# Patient Record
Sex: Female | Born: 1970 | Race: Black or African American | Hispanic: No | Marital: Single | Smoking: Never smoker
Health system: Southern US, Community
[De-identification: ages and names within clinical notes are randomized; demographics above are authoritative.]

---

## 1998-12-08 ENCOUNTER — Emergency Department (HOSPITAL_COMMUNITY): Admission: EM | Admit: 1998-12-08 | Discharge: 1998-12-09 | Payer: Self-pay | Admitting: Endocrinology

## 2000-03-11 ENCOUNTER — Encounter: Payer: Self-pay | Admitting: Orthopedic Surgery

## 2000-03-11 ENCOUNTER — Ambulatory Visit (HOSPITAL_COMMUNITY): Admission: RE | Admit: 2000-03-11 | Discharge: 2000-03-11 | Payer: Self-pay | Admitting: Orthopedic Surgery

## 2000-05-17 ENCOUNTER — Encounter: Payer: Self-pay | Admitting: Orthopedic Surgery

## 2000-05-17 ENCOUNTER — Encounter: Admission: RE | Admit: 2000-05-17 | Discharge: 2000-05-17 | Payer: Self-pay | Admitting: Orthopedic Surgery

## 2001-08-18 ENCOUNTER — Emergency Department (HOSPITAL_COMMUNITY): Admission: EM | Admit: 2001-08-18 | Discharge: 2001-08-18 | Payer: Self-pay | Admitting: Emergency Medicine

## 2004-06-23 ENCOUNTER — Emergency Department (HOSPITAL_COMMUNITY): Admission: EM | Admit: 2004-06-23 | Discharge: 2004-06-23 | Payer: Self-pay | Admitting: Emergency Medicine

## 2004-09-11 ENCOUNTER — Other Ambulatory Visit: Admission: RE | Admit: 2004-09-11 | Discharge: 2004-09-11 | Payer: Self-pay | Admitting: Family Medicine

## 2004-10-15 ENCOUNTER — Encounter: Admission: RE | Admit: 2004-10-15 | Discharge: 2004-10-15 | Payer: Self-pay | Admitting: Family Medicine

## 2005-09-05 ENCOUNTER — Emergency Department (HOSPITAL_COMMUNITY): Admission: EM | Admit: 2005-09-05 | Discharge: 2005-09-05 | Payer: Self-pay | Admitting: Family Medicine

## 2005-09-11 ENCOUNTER — Ambulatory Visit (HOSPITAL_COMMUNITY): Admission: RE | Admit: 2005-09-11 | Discharge: 2005-09-11 | Payer: Self-pay | Admitting: Family Medicine

## 2005-09-28 ENCOUNTER — Other Ambulatory Visit: Admission: RE | Admit: 2005-09-28 | Discharge: 2005-09-28 | Payer: Self-pay | Admitting: Family Medicine

## 2006-01-12 ENCOUNTER — Encounter: Admission: RE | Admit: 2006-01-12 | Discharge: 2006-01-12 | Payer: Self-pay | Admitting: Family Medicine

## 2007-03-10 ENCOUNTER — Emergency Department (HOSPITAL_COMMUNITY): Admission: EM | Admit: 2007-03-10 | Discharge: 2007-03-10 | Payer: Self-pay | Admitting: Emergency Medicine

## 2007-06-27 ENCOUNTER — Encounter: Admission: RE | Admit: 2007-06-27 | Discharge: 2007-06-27 | Payer: Self-pay | Admitting: Nephrology

## 2007-06-28 ENCOUNTER — Encounter: Admission: RE | Admit: 2007-06-28 | Discharge: 2007-06-28 | Payer: Self-pay | Admitting: Nephrology

## 2010-05-10 ENCOUNTER — Emergency Department (HOSPITAL_COMMUNITY): Admission: EM | Admit: 2010-05-10 | Discharge: 2010-05-10 | Payer: Self-pay | Admitting: Emergency Medicine
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
## 2014-07-25 ENCOUNTER — Other Ambulatory Visit: Payer: Self-pay | Admitting: Family Medicine

## 2014-07-25 DIAGNOSIS — Z1231 Encounter for screening mammogram for malignant neoplasm of breast: Secondary | ICD-10-CM

## 2014-08-07 ENCOUNTER — Ambulatory Visit
Admission: RE | Admit: 2014-08-07 | Discharge: 2014-08-07 | Disposition: A | Discharge status: Home / Self Care | Payer: BC Managed Care – PPO | Source: Ambulatory Visit | Attending: Family Medicine | Admitting: Family Medicine

## 2014-08-07 DIAGNOSIS — Z1231 Encounter for screening mammogram for malignant neoplasm of breast: Secondary | ICD-10-CM

## 2014-08-09 ENCOUNTER — Other Ambulatory Visit: Payer: Self-pay | Admitting: Family Medicine

## 2014-08-09 DIAGNOSIS — R928 Other abnormal and inconclusive findings on diagnostic imaging of breast: Secondary | ICD-10-CM

## 2014-08-15 ENCOUNTER — Ambulatory Visit
Admission: RE | Admit: 2014-08-15 | Discharge: 2014-08-15 | Disposition: A | Discharge status: Home / Self Care | Payer: BC Managed Care – PPO | Source: Ambulatory Visit | Attending: Family Medicine | Admitting: Family Medicine

## 2014-08-15 DIAGNOSIS — R928 Other abnormal and inconclusive findings on diagnostic imaging of breast: Secondary | ICD-10-CM

## 2015-09-05 ENCOUNTER — Ambulatory Visit (INDEPENDENT_AMBULATORY_CARE_PROVIDER_SITE_OTHER): Payer: BC Managed Care – PPO

## 2015-09-05 ENCOUNTER — Ambulatory Visit (INDEPENDENT_AMBULATORY_CARE_PROVIDER_SITE_OTHER): Payer: BC Managed Care – PPO | Admitting: Podiatry

## 2015-09-05 ENCOUNTER — Encounter: Payer: Self-pay | Admitting: Podiatry

## 2015-09-05 ENCOUNTER — Telehealth: Payer: Self-pay | Admitting: *Deleted

## 2015-09-05 VITALS — BP 114/68 | HR 85 | Resp 16

## 2015-09-05 DIAGNOSIS — M2011 Hallux valgus (acquired), right foot: Secondary | ICD-10-CM

## 2015-09-05 DIAGNOSIS — M722 Plantar fascial fibromatosis: Secondary | ICD-10-CM

## 2015-09-05 MED ORDER — METHYLPREDNISOLONE 4 MG PO TBPK: ORAL_TABLET | ORAL | Status: DC

## 2015-09-05 MED ORDER — MELOXICAM 15 MG PO TABS: 15.0000 mg | ORAL_TABLET | Freq: Every day | ORAL | Status: DC

## 2015-09-05 NOTE — Patient Instructions (Addendum)
Pre-Operative Instructions  Congratulations, you have decided to take an important step to improving your quality of life.  You can be assured that the doctors of [facility] will be with you every step of the way.  1. Plan to be at the surgery center/hospital at least 1 (one) hour prior to your scheduled time unless otherwise directed by the surgical center/hospital staff.  You must have a responsible adult accompany you, remain during the surgery and drive you home.  Make sure you have directions to the surgical center/hospital and know how to get there on time. 2. For hospital based surgery you will need to obtain a history and physical form from your family physician within 1 month prior to the date of surgery- we will give you a form for you primary physician.  3. We make every effort to accommodate the date you request for surgery.  There are however, times where surgery dates or times have to be moved.  We will contact you as soon as possible if a change in schedule is required.   4. No Aspirin/Ibuprofen for one week before surgery.  If you are on aspirin, any non-steroidal anti-inflammatory medications (Mobic, Aleve, Ibuprofen) you should stop taking it 7 days prior to your surgery.  You make take Tylenol  For pain prior to surgery.  5. Medications- If you are taking daily heart and blood pressure medications, seizure, reflux, allergy, asthma, anxiety, pain or diabetes medications, make sure the surgery center/hospital is aware before the day of surgery so they may notify you which medications to take or avoid the day of surgery. 6. No food or drink after midnight the night before surgery unless directed otherwise by surgical center/hospital staff. 7. No alcoholic beverages 24 hours prior to surgery.  No smoking 24 hours prior to or 24 hours after surgery. 8. Wear loose pants or shorts- loose enough to fit over bandages, boots, and casts. 9. No slip on shoes, sneakers are best. 10. Bring  your boot with you to the surgery center/hospital.  Also bring crutches or a walker if your physician has prescribed it for you.  If you do not have this equipment, it will be provided for you after surgery. 11. If you have not been contracted by the surgery center/hospital by the day before your surgery, call to confirm the date and time of your surgery. 12. Leave-time from work may vary depending on the type of surgery you have.  Appropriate arrangements should be made prior to surgery with your employer. 13. Prescriptions will be provided immediately following surgery by your doctor.  Have these filled as soon as possible after surgery and take the medication as directed. 14. Remove nail polish on the operative foot. 15. Wash the night before surgery.  The night before surgery wash the foot and leg well with the antibacterial soap provided and water paying special attention to beneath the toenails and in between the toes.  Rinse thoroughly with water and dry well with a towel.  Perform this wash unless told not to do so by your physician.  Enclosed: 1 Ice pack (please put in freezer the night before surgery)   1 Hibiclens skin cleaner   Pre-op Instructions  If you have any questions regarding the instructions, do not hesitate to call our office.  [place]: [address] [place], [place] [address] [phone]  [place]: [address] [place], [place] [address] [phone]  [place]: [address]  [place], [place] [address] [phone]  Dr. Richard   Tuchman DPM, Dr. Norman Regal DPM Dr. Richard Sikora DPM, Dr. M. Todd Hyatt DPM, Dr. Kathryn Egerton DPM   Plantar Fasciitis (Heel Spur Syndrome) with Rehab The plantar fascia is a fibrous, ligament-like, soft-tissue structure that spans the bottom of the foot. Plantar fasciitis is a condition that causes pain in the foot due to inflammation of the tissue. SYMPTOMS  16. Pain and tenderness on the underneath side of the foot. 17. Pain that  worsens with standing or walking. CAUSES  Plantar fasciitis is caused by irritation and injury to the plantar fascia on the underneath side of the foot. Common mechanisms of injury include:  Direct trauma to bottom of the foot.  Damage to a small nerve that runs under the foot where the main fascia attaches to the heel bone.  Stress placed on the plantar fascia due to bone spurs. RISK INCREASES WITH:   Activities that place stress on the plantar fascia (running, jumping, pivoting, or cutting).  Poor strength and flexibility.  Improperly fitted shoes.  Tight calf muscles.  Flat feet.  Failure to warm-up properly before activity.  Obesity. PREVENTION  Warm up and stretch properly before activity.  Allow for adequate recovery between workouts.  Maintain physical fitness:  Strength, flexibility, and endurance.  Cardiovascular fitness.  Maintain a health body weight.  Avoid stress on the plantar fascia.  Wear properly fitted shoes, including arch supports for individuals who have flat feet.  PROGNOSIS  If treated properly, then the symptoms of plantar fasciitis usually resolve without surgery. However, occasionally surgery is necessary.  RELATED COMPLICATIONS   Recurrent symptoms that may result in a chronic condition.  Problems of the lower back that are caused by compensating for the injury, such as limping.  Pain or weakness of the foot during push-off following surgery.  Chronic inflammation, scarring, and partial or complete fascia tear, occurring more often from repeated injections.  TREATMENT  Treatment initially involves the use of ice and medication to help reduce pain and inflammation. The use of strengthening and stretching exercises may help reduce pain with activity, especially stretches of the Achilles tendon. These exercises may be performed at home or with a therapist. Your caregiver may recommend that you use heel cups of arch supports to help  reduce stress on the plantar fascia. Occasionally, corticosteroid injections are given to reduce inflammation. If symptoms persist for greater than 6 months despite non-surgical (conservative), then surgery may be recommended.   MEDICATION   If pain medication is necessary, then nonsteroidal anti-inflammatory medications, such as aspirin and ibuprofen, or other minor pain relievers, such as acetaminophen, are often recommended.  Do not take pain medication within 7 days before surgery.  Prescription pain relievers may be given if deemed necessary by your caregiver. Use only as directed and only as much as you need.  Corticosteroid injections may be given by your caregiver. These injections should be reserved for the most serious cases, because they may only be given a certain number of times.  HEAT AND COLD  Cold treatment (icing) relieves pain and reduces inflammation. Cold treatment should be applied for 10 to 15 minutes every 2 to 3 hours for inflammation and pain and immediately after any activity that aggravates your symptoms. Use ice packs or massage the area with a piece of ice (ice massage).  Heat treatment may be used prior to performing the stretching and strengthening activities prescribed by your caregiver, physical therapist, or athletic trainer. Use a heat pack or soak the injury in   warm water.  SEEK IMMEDIATE MEDICAL CARE IF:  Treatment seems to offer no benefit, or the condition worsens.  Any medications produce adverse side effects.  EXERCISES- RANGE OF MOTION (ROM) AND STRETCHING EXERCISES - Plantar Fasciitis (Heel Spur Syndrome) These exercises may help you when beginning to rehabilitate your injury. Your symptoms may resolve with or without further involvement from your physician, physical therapist or athletic trainer. While completing these exercises, remember:   Restoring tissue flexibility helps normal motion to return to the joints. This allows healthier, less  painful movement and activity.  An effective stretch should be held for at least 30 seconds.  A stretch should never be painful. You should only feel a gentle lengthening or release in the stretched tissue.  RANGE OF MOTION - Toe Extension, Flexion  Sit with your right / left leg crossed over your opposite knee.  Grasp your toes and gently pull them back toward the top of your foot. You should feel a stretch on the bottom of your toes and/or foot.  Hold this stretch for 10 seconds.  Now, gently pull your toes toward the bottom of your foot. You should feel a stretch on the top of your toes and or foot.  Hold this stretch for 10 seconds. Repeat  times. Complete this stretch 3 times per day.   RANGE OF MOTION - Ankle Dorsiflexion, Active Assisted  Remove shoes and sit on a chair that is preferably not on a carpeted surface.  Place right / left foot under knee. Extend your opposite leg for support.  Keeping your heel down, slide your right / left foot back toward the chair until you feel a stretch at your ankle or calf. If you do not feel a stretch, slide your bottom forward to the edge of the chair, while still keeping your heel down.  Hold this stretch for 10 seconds. Repeat 3 times. Complete this stretch 2 times per day.   STRETCH  Gastroc, Standing  Place hands on wall.  Extend right / left leg, keeping the front knee somewhat bent.  Slightly point your toes inward on your back foot.  Keeping your right / left heel on the floor and your knee straight, shift your weight toward the wall, not allowing your back to arch.  You should feel a gentle stretch in the right / left calf. Hold this position for 10 seconds. Repeat 3 times. Complete this stretch 2 times per day.  STRETCH  Soleus, Standing  Place hands on wall.  Extend right / left leg, keeping the other knee somewhat bent.  Slightly point your toes inward on your back foot.  Keep your right / left heel on the  floor, bend your back knee, and slightly shift your weight over the back leg so that you feel a gentle stretch deep in your back calf.  Hold this position for 10 seconds. Repeat 3 times. Complete this stretch 2 times per day.  STRETCH  Gastrocsoleus, Standing  Note: This exercise can place a lot of stress on your foot and ankle. Please complete this exercise only if specifically instructed by your caregiver.   Place the ball of your right / left foot on a step, keeping your other foot firmly on the same step.  Hold on to the wall or a rail for balance.  Slowly lift your other foot, allowing your body weight to press your heel down over the edge of the step.  You should feel a stretch in your right /   left calf.  Hold this position for 10 seconds.  Repeat this exercise with a slight bend in your right / left knee. Repeat 3 times. Complete this stretch 2 times per day.   STRENGTHENING EXERCISES - Plantar Fasciitis (Heel Spur Syndrome)  These exercises may help you when beginning to rehabilitate your injury. They may resolve your symptoms with or without further involvement from your physician, physical therapist or athletic trainer. While completing these exercises, remember:   Muscles can gain both the endurance and the strength needed for everyday activities through controlled exercises.  Complete these exercises as instructed by your physician, physical therapist or athletic trainer. Progress the resistance and repetitions only as guided.  STRENGTH - Towel Curls  Sit in a chair positioned on a non-carpeted surface.  Place your foot on a towel, keeping your heel on the floor.  Pull the towel toward your heel by only curling your toes. Keep your heel on the floor. Repeat 3 times. Complete this exercise 2 times per day.  STRENGTH - Ankle Inversion  Secure one end of a rubber exercise band/tubing to a fixed object (table, pole). Loop the other end around your foot just before your  toes.  Place your fists between your knees. This will focus your strengthening at your ankle.  Slowly, pull your big toe up and in, making sure the band/tubing is positioned to resist the entire motion.  Hold this position for 10 seconds.  Have your muscles resist the band/tubing as it slowly pulls your foot back to the starting position. Repeat 3 times. Complete this exercises 2 times per day.  Document Released: 11/09/2005 Document Revised: 02/01/2012 Document Reviewed: 02/21/2009 [employer] Patient Information 2014 [employer].  

## 2015-09-05 NOTE — Progress Notes (Signed)
   Subjective:    Patient ID: Christine Nunez, female    DOB: 01-30-1971, 44 y.o.   MRN: [id number]  HPI: She presents today with a chief complaint of a painful right heel and a painful first metatarsophalangeal joint. She states that is thin painful and is starting to inhibit her ability to perform her daily activities. 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 She states that her heel started hurting several months ago and has not let up. She's tried different shoes while at home and anti-inflammatories to no avail. We performed bunion surgery on her left foot in 2008 and she did very well with that at that time.    Review of Systems  Neurological: Positive for headaches.  Hematological: Bruises/bleeds easily.  All other systems reviewed and are negative.      Objective:   Physical Exam: 44 year old black female presents in no acute distress vital signs stable alert and oriented 3. Pulses are strongly palpable. Neurologic sensorium is intact per Semmes-Weinstein monofilament. Deep tendon reflexes are intact bilateral and muscle strength is 5 over 5 dorsiflexion plantar flexors and inverters and everters all intrinsic musculature is intact. Orthopedic evaluation demonstrates all joints distal to the ankle are full range of motion without crepitation. She has pain on range of motion and palpation of the first metatarsophalangeal joint of the right foot. Hallux valgus deformity is noted. She also has pain on palpation medial calcaneal tubercle of the right heel. Radiographs 3 views of the right foot does demonstrate a plantar distally oriented calcaneal heel spur with soft tissue increase in density of the plantar fascial calcaneal insertion site it also demonstrates hallux valgus deformity with an increase in the first intermetatarsal angle greater than 15. Hallux adductus angle is greater than 15 as well. Cutaneous evaluation  demonstrates supple well-hydrated cutis no erythema edema saline as drainage or odor no open wounds or lesions.        Assessment & Plan:  Assessment: Plantar fasciitis right. Hallux abductovalgus deformity right.  Plan: Discussed etiology pathology conservative versus surgical therapies. I started her on a Medrol Dosepak to be followed by meloxicam. I injected her right heel today with Kenalog and local anesthetic to alleviate her plantar fasciitis. Placed her in a plantar fascial brace and a night splint. She has a Manufacturing systems engineerCam Walker for her postop recovery. We consented her today for a [facility] bunion repair with screw fixation right foot. I answered all questions regarding this procedure to the best of my ability in layman's terms. He understood this was amenable to it and signed off pages of the consent form. We did discuss the possible postoperative complications which may include but are not limited to postop pain bleeding swelling infection recurrence overcorrection under correction need for further surgery loss of digit loss of limb loss of life.  Arbutus Pedodd Hyatt DPM

## 2015-09-05 NOTE — Telephone Encounter (Signed)
"  I was there this morning.  He discussed surgery.  I'd like to set that up."  When would you like to schedule?  "I'd like to do it on December 16."  Okay, I'll get that scheduled.  Go ahead and register with the surgical center.  They will call you a day or two prior to surgery date and give you an arrival time.

## 2015-09-30 ENCOUNTER — Encounter: Payer: Self-pay | Admitting: Podiatry

## 2015-09-30 DIAGNOSIS — M722 Plantar fascial fibromatosis: Secondary | ICD-10-CM

## 2015-10-08 ENCOUNTER — Ambulatory Visit (INDEPENDENT_AMBULATORY_CARE_PROVIDER_SITE_OTHER): Payer: BC Managed Care – PPO | Admitting: Podiatry

## 2015-10-08 ENCOUNTER — Encounter: Payer: Self-pay | Admitting: Podiatry

## 2015-10-08 VITALS — BP 121/73 | HR 68 | Resp 16

## 2015-10-08 DIAGNOSIS — M722 Plantar fascial fibromatosis: Secondary | ICD-10-CM | POA: Diagnosis not present

## 2015-10-08 DIAGNOSIS — M2011 Hallux valgus (acquired), right foot: Secondary | ICD-10-CM | POA: Diagnosis not present

## 2015-10-08 NOTE — Progress Notes (Signed)
She presents today for follow-up of her plantar fasciitis of her right foot and to pick up her orthotics. She states that her right foot is 100% improved as far as the plantar fasciitis case. She's ready to have surgery on her bunion in December.  Objective: Vital signs are stable she is alert and oriented 3. Pulses are strongly palpable. She has no pain on palpation medial calcaneal tubercle of the right heel.  Assessment: Hallux abductovalgus deformity right foot. Resolved plantar fasciitis right foot.  Plan: Discussed etiology pathology conservative versus surgical therapies. Surgery will be performed 11/08/2015. We dispensed orthotics today was given both oral and home-going instruction. She will continue all conservative therapies including plantar fascial braces orthotics night splint and oral medication until surgery.

## 2015-11-01 ENCOUNTER — Telehealth: Payer: Self-pay | Admitting: *Deleted

## 2015-11-01 NOTE — Telephone Encounter (Signed)
"  I'm scheduled for surgery on the 16th.  Do I need to start paying you all?"  We normally file it with your insurance after surgery then we will send you a statement.  "Okay, do you know what time my surgery will be?"  No, the surgical center normally calls you a day or 2 prior to surgery and they will give you the arrival time.

## 2015-11-07 ENCOUNTER — Other Ambulatory Visit: Payer: Self-pay | Admitting: Podiatry

## 2015-11-07 MED ORDER — PROMETHAZINE HCL 25 MG PO TABS: 25.0000 mg | ORAL_TABLET | Freq: Three times a day (TID) | ORAL | Status: DC | PRN

## 2015-11-07 MED ORDER — OXYCODONE-ACETAMINOPHEN 10-325 MG PO TABS: ORAL_TABLET | ORAL | Status: DC

## 2015-11-07 MED ORDER — CEPHALEXIN 500 MG PO CAPS: 500.0000 mg | ORAL_CAPSULE | Freq: Three times a day (TID) | ORAL | Status: DC

## 2015-11-08 DIAGNOSIS — M2011 Hallux valgus (acquired), right foot: Secondary | ICD-10-CM | POA: Diagnosis not present

## 2015-11-14 ENCOUNTER — Ambulatory Visit (INDEPENDENT_AMBULATORY_CARE_PROVIDER_SITE_OTHER): Payer: BC Managed Care – PPO | Admitting: Podiatry

## 2015-11-14 ENCOUNTER — Encounter: Payer: Self-pay | Admitting: Podiatry

## 2015-11-14 ENCOUNTER — Ambulatory Visit (INDEPENDENT_AMBULATORY_CARE_PROVIDER_SITE_OTHER): Payer: BC Managed Care – PPO

## 2015-11-14 VITALS — BP 129/87 | HR 129 | Resp 16

## 2015-11-14 DIAGNOSIS — M2011 Hallux valgus (acquired), right foot: Secondary | ICD-10-CM

## 2015-11-14 DIAGNOSIS — Z9889 Other specified postprocedural states: Secondary | ICD-10-CM

## 2015-11-18 NOTE — Progress Notes (Signed)
She presents today 1 week [facility]. She states this seems to be really hurting a lot more than I thought it would. She denies fever chills nausea vomiting muscle aches and pains other than those of the foot. No chest pain no shortness of breath. No calf Pain.  Objective: Vital signs stable alert and oriented. Dry sterile dressing intact once removed demonstrates moderate edema no erythema cellulitis drainage or odor. There is considerable swelling in the first interdigital space consistent with hematoma. She states that the dressing being off feels much better. Radiographs confirm first metatarsal osteotomy and good physical position with internal fixation.  Assessment: Well-healing surgical for status post 1 week.  Plan: [place] today dressing compressive dressing and continue range of motion exercises. Continue use of the Cam Walker keep his foot elevated and dry ankle in. Follow up with her in 1 week.

## 2015-11-21 ENCOUNTER — Ambulatory Visit (INDEPENDENT_AMBULATORY_CARE_PROVIDER_SITE_OTHER): Payer: BC Managed Care – PPO | Admitting: Podiatry

## 2015-11-21 ENCOUNTER — Encounter: Payer: Self-pay | Admitting: Podiatry

## 2015-11-21 ENCOUNTER — Ambulatory Visit (INDEPENDENT_AMBULATORY_CARE_PROVIDER_SITE_OTHER): Payer: BC Managed Care – PPO

## 2015-11-21 VITALS — BP 113/71 | HR 80 | Resp 16

## 2015-11-21 DIAGNOSIS — Z9889 Other specified postprocedural states: Secondary | ICD-10-CM | POA: Diagnosis not present

## 2015-11-21 DIAGNOSIS — M2011 Hallux valgus (acquired), right foot: Secondary | ICD-10-CM | POA: Diagnosis not present

## 2015-11-21 NOTE — Progress Notes (Signed)
DOS 11-08-15  [facility] bunionectomy right
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
## 2015-12-05 ENCOUNTER — Ambulatory Visit (INDEPENDENT_AMBULATORY_CARE_PROVIDER_SITE_OTHER): Payer: BC Managed Care – PPO | Admitting: Podiatry

## 2015-12-05 ENCOUNTER — Ambulatory Visit (INDEPENDENT_AMBULATORY_CARE_PROVIDER_SITE_OTHER): Payer: BC Managed Care – PPO

## 2015-12-05 ENCOUNTER — Encounter: Payer: Self-pay | Admitting: Podiatry

## 2015-12-05 VITALS — BP 116/67 | HR 74 | Resp 12

## 2015-12-05 DIAGNOSIS — Z9889 Other specified postprocedural states: Secondary | ICD-10-CM

## 2015-12-05 DIAGNOSIS — M2011 Hallux valgus (acquired), right foot: Secondary | ICD-10-CM | POA: Diagnosis not present

## 2015-12-05 NOTE — Progress Notes (Signed)
She presents today for follow-up of her all cement repair right foot date of surgery 11/08/2015. She states this seems to be doing pretty well but still some sore.  Objective: Vital signs are stable alert and oriented 3. Pulses are strongly palpable. She's great range of motion with minimal edema and no erythema cellulitis drainage or odor. Radiographs confirm well-healed distal osteotomy.  Assessment well-healing surgical foot right.  Plan: Slowly try to transition into a regular pair of shoes and I will follow-up with her 1 month.

## 2015-12-26 ENCOUNTER — Encounter: Payer: Self-pay | Admitting: Podiatry

## 2015-12-26 ENCOUNTER — Ambulatory Visit (INDEPENDENT_AMBULATORY_CARE_PROVIDER_SITE_OTHER): Payer: BC Managed Care – PPO | Admitting: Podiatry

## 2015-12-26 ENCOUNTER — Ambulatory Visit (INDEPENDENT_AMBULATORY_CARE_PROVIDER_SITE_OTHER): Payer: BC Managed Care – PPO

## 2015-12-26 VITALS — BP 109/58 | HR 74 | Resp 16

## 2015-12-26 DIAGNOSIS — M2011 Hallux valgus (acquired), right foot: Secondary | ICD-10-CM | POA: Diagnosis not present

## 2015-12-26 DIAGNOSIS — Z9889 Other specified postprocedural states: Secondary | ICD-10-CM | POA: Diagnosis not present

## 2015-12-29 NOTE — Progress Notes (Signed)
She presents today for follow-up of an [facility] bunion repair right foot. Date of surgery 11/11/2015. He states this been doing okay.  Objective: Vital signs are stable she is alert and oriented 3. Pulses are palpable. Range of motion is fantastic she has mild edema no erythema cellulitis drainage or odor. Radiographs confirm well-healing osteotomy with internal fixation.  Assessment: Well-known bunion surgery.  Plan: Follow up with me in 1 month. Continue regular shoe gear. Encouraged range of motion exercises.

## 2016-01-23 ENCOUNTER — Ambulatory Visit (INDEPENDENT_AMBULATORY_CARE_PROVIDER_SITE_OTHER): Payer: BC Managed Care – PPO

## 2016-01-23 ENCOUNTER — Ambulatory Visit (INDEPENDENT_AMBULATORY_CARE_PROVIDER_SITE_OTHER): Payer: BC Managed Care – PPO | Admitting: Podiatry

## 2016-01-23 ENCOUNTER — Encounter: Payer: Self-pay | Admitting: Podiatry

## 2016-01-23 VITALS — BP 130/69 | HR 81 | Resp 16

## 2016-01-23 DIAGNOSIS — M2011 Hallux valgus (acquired), right foot: Secondary | ICD-10-CM | POA: Diagnosis not present

## 2016-01-23 DIAGNOSIS — Z9889 Other specified postprocedural states: Secondary | ICD-10-CM

## 2016-01-23 NOTE — Progress Notes (Signed)
She presents today for follow-up regarding her right foot surgery status post [facility] bunion repair date of surgery 11/01/2015. She states that her foot is doing very well as she has increased her numbers steps per day.  Objective: Vital signs are stable she is alert and oriented 3. Pulses are palpable. Currently today she has laryngitis. She also has a great range of motion of the first metatarsophalangeal joint of the right foot radiographs confirmed well-healed surgical bone.  Assessment: Well-healing surgical foot right.  Plan: Get back to her regular routine and follow-up with me in 1 month

## 2016-02-20 ENCOUNTER — Ambulatory Visit (INDEPENDENT_AMBULATORY_CARE_PROVIDER_SITE_OTHER): Payer: BC Managed Care – PPO | Admitting: Podiatry

## 2016-02-20 ENCOUNTER — Ambulatory Visit (INDEPENDENT_AMBULATORY_CARE_PROVIDER_SITE_OTHER): Payer: BC Managed Care – PPO

## 2016-02-20 ENCOUNTER — Encounter: Payer: Self-pay | Admitting: Podiatry

## 2016-02-20 VITALS — BP 120/69 | HR 67 | Resp 16

## 2016-02-20 DIAGNOSIS — M2011 Hallux valgus (acquired), right foot: Secondary | ICD-10-CM

## 2016-02-20 DIAGNOSIS — Z9889 Other specified postprocedural states: Secondary | ICD-10-CM

## 2016-02-22 NOTE — Progress Notes (Signed)
She presents today for a final postop visit regarding Christine Nunez bunion repair of her right foot from December 2016. She states that occasionally I feel something in there but is feeling much better than it was previously.  Objective: Vital signs are stable alert and oriented 3. Pulses are palpable pain. She has great range of motion of the first metatarsophalangeal joint of the right foot. Radiographs confirm well-healed osteotomy with internal fixation intact. No signs of infection and no fractures.  Assessment: Well-healing surgical foot right.  Plan: Follow up with me on an as-needed basis.

## 2018-12-24 ENCOUNTER — Other Ambulatory Visit: Payer: Self-pay

## 2018-12-24 ENCOUNTER — Emergency Department (HOSPITAL_COMMUNITY): Payer: BC Managed Care – PPO

## 2018-12-24 ENCOUNTER — Encounter (HOSPITAL_COMMUNITY): Payer: Self-pay | Admitting: Emergency Medicine

## 2018-12-24 ENCOUNTER — Emergency Department (HOSPITAL_COMMUNITY)
Admission: EM | Admit: 2018-12-24 | Discharge: 2018-12-24 | Disposition: A | Discharge status: Home / Self Care | Payer: BC Managed Care – PPO | Attending: Emergency Medicine | Admitting: Emergency Medicine

## 2018-12-24 DIAGNOSIS — R0789 Other chest pain: Secondary | ICD-10-CM | POA: Diagnosis not present

## 2018-12-24 DIAGNOSIS — Z91040 Latex allergy status: Secondary | ICD-10-CM | POA: Insufficient documentation

## 2018-12-24 DIAGNOSIS — R110 Nausea: Secondary | ICD-10-CM | POA: Insufficient documentation

## 2018-12-24 DIAGNOSIS — Z79899 Other long term (current) drug therapy: Secondary | ICD-10-CM | POA: Diagnosis not present

## 2018-12-24 LAB — BASIC METABOLIC PANEL
Anion gap: 12 (ref 5–15)
BUN: 7 mg/dL (ref 6–20)
CALCIUM: 9.3 mg/dL (ref 8.9–10.3)
CO2: 22 mmol/L (ref 22–32)
Chloride: 103 mmol/L (ref 98–111)
Creatinine, Ser: 0.83 mg/dL (ref 0.44–1.00)
GFR calc Af Amer: >60 mL/min (ref >60)
GFR calc non Af Amer: >60 mL/min (ref >60)
Glucose, Bld: 96 mg/dL (ref 70–99)
Potassium: 3.6 mmol/L (ref 3.5–5.1)
Sodium: 137 mmol/L (ref 135–145)

## 2018-12-24 LAB — CBC
HCT: 41.8 % (ref 36.0–46.0)
Hemoglobin: 13.8 g/dL (ref 12.0–15.0)
MCH: 30.7 pg (ref 26.0–34.0)
MCHC: 33 g/dL (ref 30.0–36.0)
MCV: 93.1 fL (ref 80.0–100.0)
Platelets: 248 10*3/uL (ref 150–400)
RBC: 4.49 MIL/uL (ref 3.87–5.11)
RDW: 12.3 % (ref 11.5–15.5)
WBC: 8.6 10*3/uL (ref 4.0–10.5)
nRBC: 0 % (ref 0.0–0.2)

## 2018-12-24 LAB — I-STAT BETA HCG BLOOD, ED (MC, WL, AP ONLY): I-stat hCG, quantitative: <5 m[IU]/mL (ref <5)

## 2018-12-24 LAB — D-DIMER, QUANTITATIVE: D-Dimer, Quant: 0.46 ug/mL-FEU (ref 0.00–0.50)

## 2018-12-24 LAB — I-STAT TROPONIN, ED
Troponin i, poc: 0.02 ng/mL (ref 0.00–0.08)
Troponin i, poc: 0 ng/mL (ref 0.00–0.08)

## 2018-12-24 MED ORDER — ONDANSETRON HCL 4 MG PO TABS: 4.0000 mg | ORAL_TABLET | Freq: Three times a day (TID) | ORAL | 0 refills | Status: AC | PRN

## 2018-12-24 MED ORDER — ONDANSETRON HCL 4 MG PO TABS: 4.0000 mg | ORAL_TABLET | Freq: Three times a day (TID) | ORAL | 0 refills | Status: DC | PRN

## 2018-12-24 MED ORDER — ONDANSETRON HCL 4 MG/2ML IJ SOLN
4.0000 mg | Freq: Once | INTRAMUSCULAR | Status: AC
  Administered 2018-12-24: 4 mg via INTRAVENOUS
  Filled 2018-12-24: qty 2

## 2018-12-24 MED ORDER — SODIUM CHLORIDE 0.9% FLUSH
3.0000 mL | Freq: Once | INTRAVENOUS | Status: AC
  Administered 2018-12-24: 3 mL via INTRAVENOUS

## 2018-12-24 MED ORDER — MELOXICAM 15 MG PO TABS: 15.0000 mg | ORAL_TABLET | Freq: Every day | ORAL | 0 refills | Status: AC

## 2018-12-24 MED ORDER — MELOXICAM 15 MG PO TABS: 15.0000 mg | ORAL_TABLET | Freq: Every day | ORAL | 0 refills | Status: DC

## 2018-12-24 NOTE — ED Provider Notes (Signed)
[facility]   CSN: [id number] Arrival date & time: 12/24/18  1552     History   Chief Complaint Chief Complaint  Patient presents with  . Chest Pain    HPI Christine Nunez is a 48 y.o. female. Who presents to the ER with cc of left lateral chest patient had onset of symptoms on the left side of her chest radiating into anterior chest.  She has associated pain, numbness and tingling rating down the arm that does not reach the fingers.  She also has associated nausea without vomiting.  She denies diaphoresis, shortness of breath.  Nothing seems to make her pain better or worse.  It has been intermittent since onset of the symptoms at 3:30 PM.. Patient states that yesterday she also had discomfort in her left leg.  She states that she felt like she "just needed to stretch it" but also felt pain radiating up her leg. Denies unilateral leg swelling, history of DVT or PE. Risk factors for CAD include hyperlipidemia for which she does not take medication and obesity. The history is provided by the patient. No language interpreter was used.    History reviewed. No pertinent past medical history.  There are no active problems to display for this patient.   History reviewed. No pertinent surgical history.   OB History   No obstetric history on file.      Home Medications    Prior to Admission medications   Medication Sig Start Date End Date Taking? Authorizing Provider  atorvastatin (LIPITOR) 10 MG tablet Take 10 mg by mouth. 08/20/15   [provider]  meloxicam (MOBIC) 15 MG tablet Take 1 tablet (15 mg total) by mouth daily. 12/24/18   Sabas SousBero, Michael M, MD  ondansetron (ZOFRAN) 4 MG tablet Take 1 tablet (4 mg total) by mouth every 8 (eight) hours as needed for nausea or vomiting. 12/24/18   Sabas SousBero, Michael M, MD    Family History No family history on file.  Social History Social History   Tobacco Use  . Smoking  status: Never Smoker  Substance Use Topics  . Alcohol use: Yes    Alcohol/week: 0.0 standard drinks  . Drug use: Not on file     Allergies   Terbutaline; Citric acid; Latex; and Sulfur   Review of Systems Review of Systems  Ten systems reviewed and are negative for acute change, except as noted in the HPI.   Physical Exam Updated Vital Signs BP 127/69 (BP Location: Right Arm)   Pulse 64   Temp (!) 97.5 F (36.4 C) (Oral)   Resp 18   Ht 5' 2.5" (1.588 m)   Wt 95.3 kg   SpO2 98%   BMI 37.80 kg/m   Physical Exam Vitals signs and nursing note reviewed.  Constitutional:      General: She is not in acute distress.    Appearance: She is well-developed. She is not diaphoretic.  HENT:     Head: Normocephalic and atraumatic.  Eyes:     General: No scleral icterus.    Conjunctiva/sclera: Conjunctivae normal.  Neck:     Musculoskeletal: Normal range of motion and neck supple.     Vascular: No JVD.  Cardiovascular:     Rate and Rhythm: Normal rate and regular rhythm.     Heart sounds: Normal heart sounds. No murmur. No friction rub. No gallop.   Pulmonary:     Effort: Pulmonary effort is normal. No respiratory  distress.     Breath sounds: Normal breath sounds.  Chest:    Abdominal:     General: Bowel sounds are normal. There is no distension.     Palpations: Abdomen is soft. There is no mass.     Tenderness: There is no abdominal tenderness. There is no guarding.  Musculoskeletal:     Right lower leg: No edema.     Left lower leg: No edema.  Skin:    General: Skin is warm and dry.  Neurological:     Mental Status: She is alert and oriented to person, place, and time.  Psychiatric:        Behavior: Behavior normal.      ED Treatments / Results  Labs (all labs ordered are listed, but only abnormal results are displayed) Labs Reviewed  BASIC METABOLIC PANEL  CBC  D-DIMER, QUANTITATIVE (NOT AT [facility])  I-STAT TROPONIN, ED  I-STAT BETA HCG BLOOD, ED ([facility], [facility],  [facility] ONLY)  I-STAT TROPONIN, ED    EKG EKG Interpretation  Date/Time:  Saturday December 24 2018 16:20:24 EST Ventricular Rate:  67 PR Interval:    QRS Duration: 80 QT Interval:  391 QTC Calculation: 413 R Axis:   39 Text Interpretation:  Sinus rhythm Abnormal R-wave progression, early transition Confirmed by Kennis Carina [phone]) on 12/24/2018 9:08:21 PM   Radiology Dg Chest 2 View  Result Date: 12/24/2018 CLINICAL DATA:  Pt is having left sided chest pain that radiates to left arm for 1 hour. Also c/o nausea. No prior occurrence. Pt was having bilateral leg pain yesterday. Pt has family hx of early CHF.Hx bronchitis, former smoker (over 20 years ago) EXAM: CHEST - 2 VIEW COMPARISON:  None. FINDINGS: The heart size and mediastinal contours are within normal limits. Both lungs are clear. No pleural effusion or pneumothorax. The visualized skeletal structures are unremarkable. IMPRESSION: No active cardiopulmonary disease. Electronically Signed   By: Amie Portland M.D.   On: 12/24/2018 16:42    Procedures Procedures (including critical care time)  Medications Ordered in ED Medications  sodium chloride flush (NS) 0.9 % injection 3 mL (3 mLs Intravenous Given 12/24/18 1816)  ondansetron (ZOFRAN) injection 4 mg (4 mg Intravenous Given 12/24/18 1819)     Initial Impression / Assessment and Plan / ED Course  I have reviewed the triage vital signs and the nursing notes.  Pertinent labs & imaging results that were available during my care of the patient were reviewed by me and considered in my medical decision making (see chart for details).  Clinical Course as of Dec 25 38  Sat Dec 24, 2018  1753 Troponin i, poc: 0.02 [AH]    Clinical Course User Index [AH] Arthor Captain, PA-C    48 y/o female with cp. The emergent differential diagnosis of chest pain includes: Acute coronary syndrome, pericarditis, aortic dissection, pulmonary embolism, tension pneumothorax, pneumonia, and  esophageal rupture. Heart score is 2.  Negative d-dimer.  Patient here with delta troponins and no change in her symptoms.  Although chest pain is not reproducible doubt this is a reflection of a life limb or organ threatening cause of chest pain.  Patient will be discharged to follow-up with her PCP and cardiology for outpatient stress testing.  She will follow-up with her PCP regarding her left arm paresthesia.  Patient appears otherwise safe for discharge at this time.  Discussed return precautions   Final Clinical Impressions(s) / ED Diagnoses   Final diagnoses:  Atypical chest pain  Nausea  ED Discharge Orders         Ordered    meloxicam (MOBIC) 15 MG tablet  Daily,   Status:  Discontinued     12/24/18 2051    ondansetron (ZOFRAN) 4 MG tablet  Every 8 hours PRN,   Status:  Discontinued     12/24/18 2051    meloxicam (MOBIC) 15 MG tablet  Daily     12/24/18 2107    ondansetron (ZOFRAN) 4 MG tablet  Every 8 hours PRN     12/24/18 2107           Arthor Captain, PA-C 12/25/18 0041    Sabas Sous, MD 12/25/18 1620

## 2018-12-24 NOTE — ED Triage Notes (Signed)
Pt is having left sided chest pain that radiates to left arm for 1 hour. Pt was having bilateral leg pain yesterday. Pt has family hx of early CHF

## 2018-12-24 NOTE — ED Notes (Signed)
Lab to add on dimer  

## 2018-12-24 NOTE — Discharge Instructions (Signed)

## 2021-01-30 IMAGING — CR DG CHEST 2V
2 series · 2 of 2 positions shown · non-contrast
Comparison: None.

CLINICAL DATA: Pt is having left sided chest pain that radiates to
left arm for 1 hour. Also c/o nausea. No prior occurrence. Pt was
having bilateral leg pain yesterday. Pt has family hx of early
CHF.Hx bronchitis, former smoker (over 20 years ago)

EXAM:
CHEST - 2 VIEW

[chest pa]
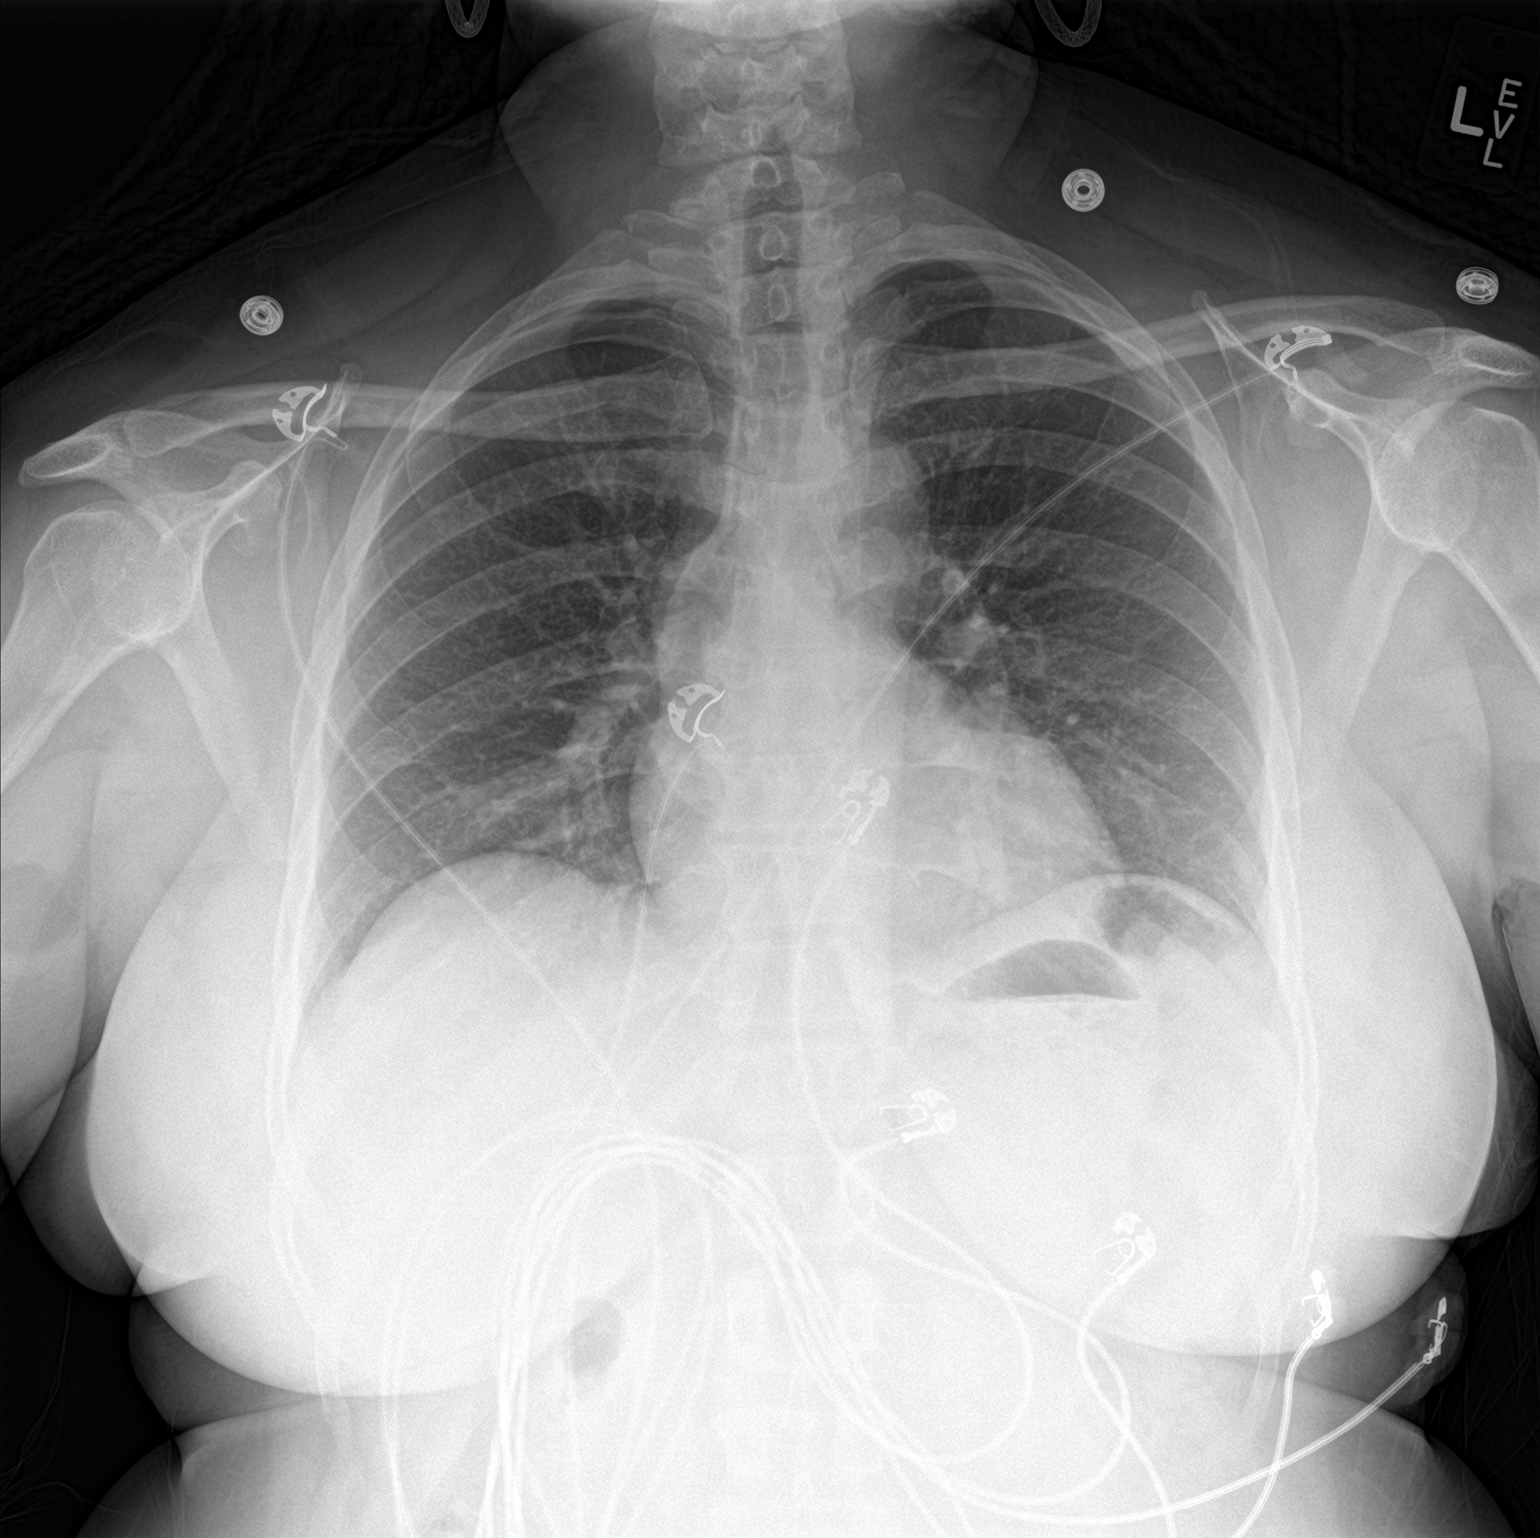

[chest lat]
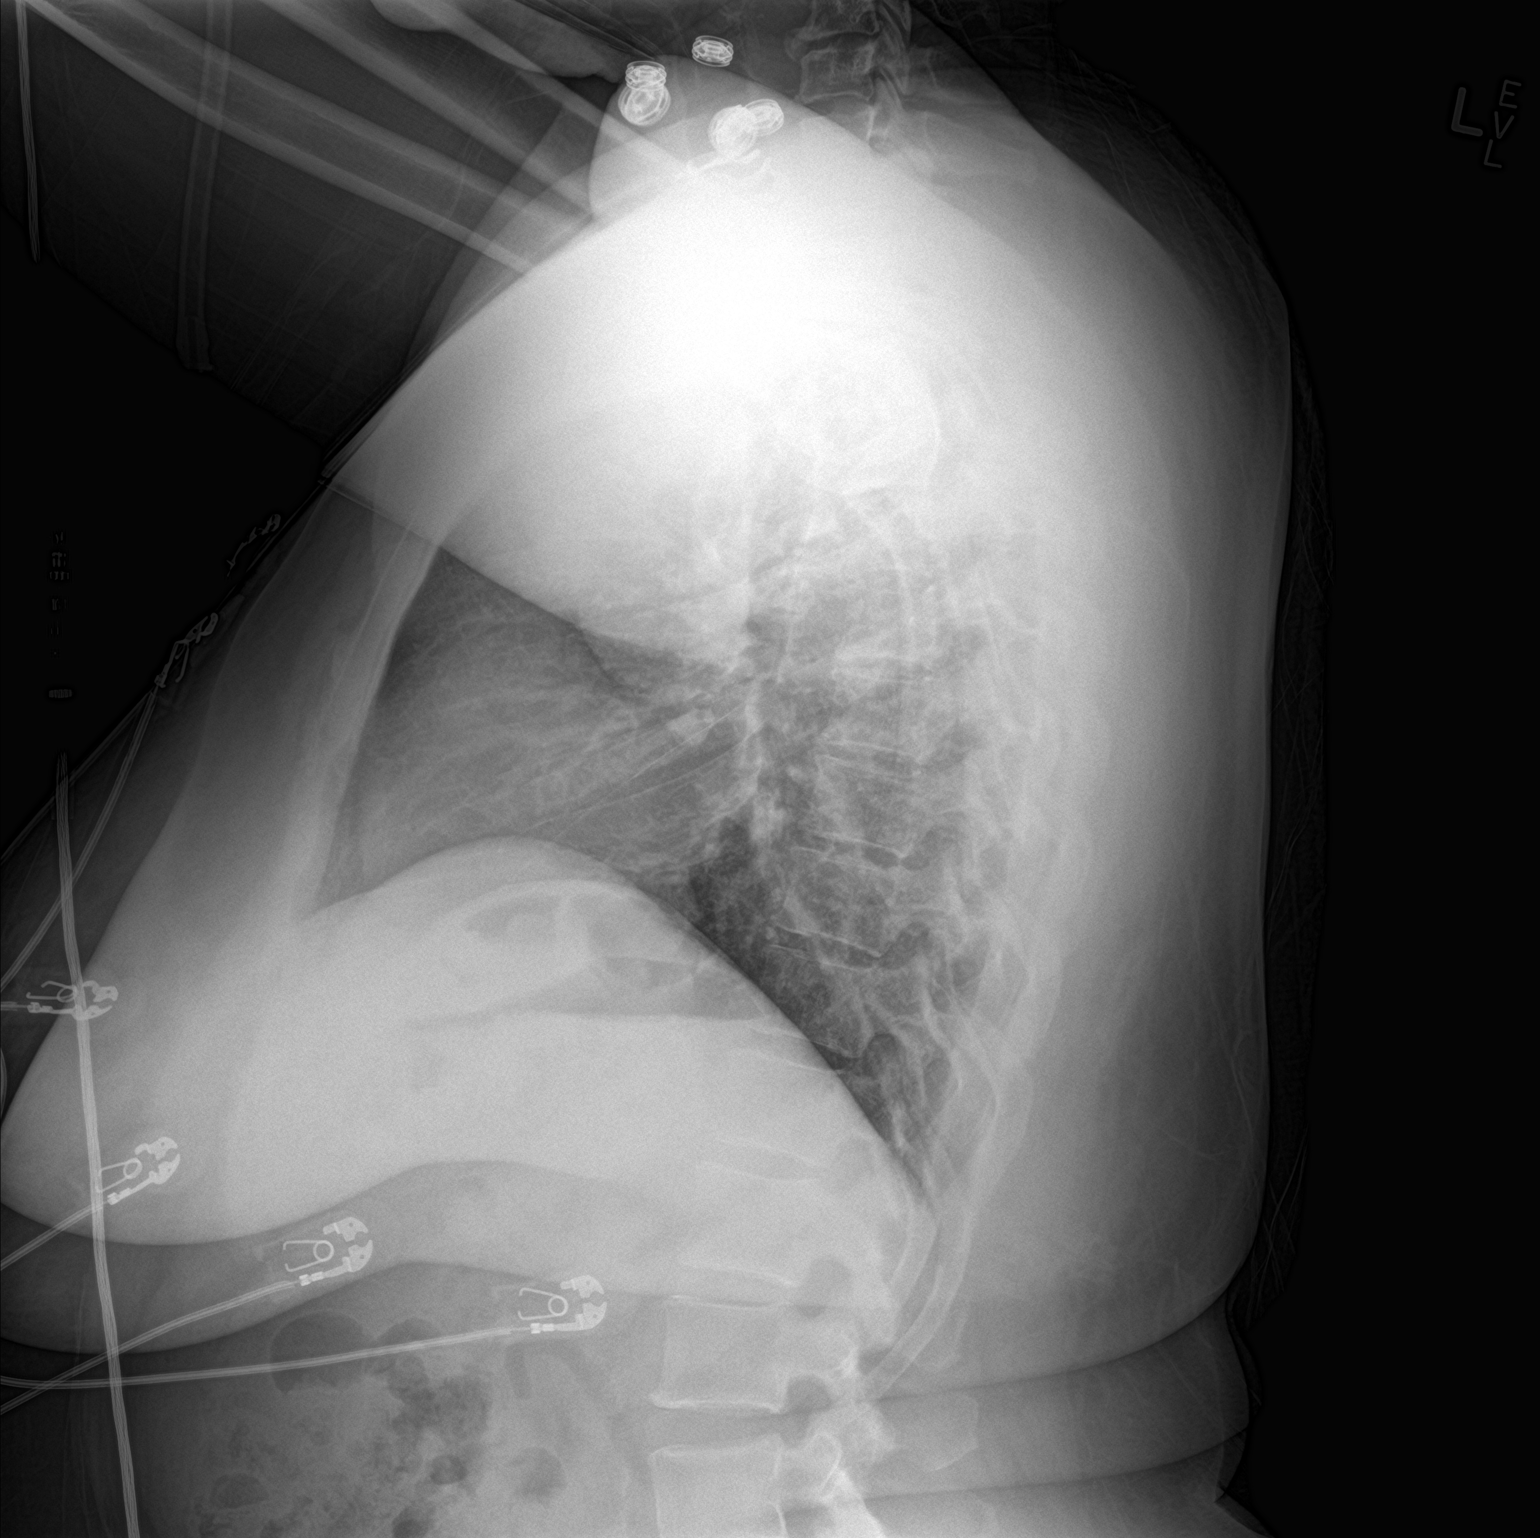

[2 of 2 positions shown; findings below may reference images not displayed]

FINDINGS: The heart size and mediastinal contours are within normal limits.
Both lungs are clear. No pleural effusion or pneumothorax. The
visualized skeletal structures are unremarkable.
IMPRESSION: No active cardiopulmonary disease.
